# Patient Record
Sex: Female | Born: 1960 | Race: White | Hispanic: No | Marital: Married | Smoking: Never smoker
Health system: Southern US, Community
[De-identification: ages and names within clinical notes are randomized; demographics above are authoritative.]

## PROBLEM LIST (undated history)

## (undated) DIAGNOSIS — G43909 Migraine, unspecified, not intractable, without status migrainosus: Secondary | ICD-10-CM

## (undated) HISTORY — DX: Migraine, unspecified, not intractable, without status migrainosus: G43.909

## (undated) HISTORY — PX: TUBAL LIGATION: SHX77

## (undated) HISTORY — PX: LAPAROSCOPY: SHX197

---

## 1999-07-13 ENCOUNTER — Other Ambulatory Visit: Admission: RE | Admit: 1999-07-13 | Discharge: 1999-07-13 | Payer: Self-pay | Admitting: Internal Medicine

## 2001-09-10 ENCOUNTER — Other Ambulatory Visit: Admission: RE | Admit: 2001-09-10 | Discharge: 2001-09-10 | Payer: Self-pay | Admitting: Obstetrics and Gynecology

## 2002-05-09 HISTORY — PX: FOOT SURGERY: SHX648

## 2002-05-09 HISTORY — PX: ABDOMINAL SURGERY: SHX537

## 2003-05-15 ENCOUNTER — Other Ambulatory Visit: Admission: RE | Admit: 2003-05-15 | Discharge: 2003-05-15 | Payer: Self-pay | Admitting: Obstetrics and Gynecology

## 2003-10-03 ENCOUNTER — Inpatient Hospital Stay (HOSPITAL_COMMUNITY): Admission: RE | Admit: 2003-10-03 | Discharge: 2003-10-06 | Payer: Self-pay | Admitting: Obstetrics and Gynecology

## 2003-10-28 ENCOUNTER — Ambulatory Visit: Admission: RE | Admit: 2003-10-28 | Discharge: 2003-10-28 | Payer: Self-pay | Admitting: Family Medicine

## 2006-03-28 ENCOUNTER — Other Ambulatory Visit: Admission: RE | Admit: 2006-03-28 | Discharge: 2006-03-28 | Payer: Self-pay | Admitting: Obstetrics and Gynecology

## 2006-04-03 ENCOUNTER — Ambulatory Visit (HOSPITAL_COMMUNITY): Admission: RE | Admit: 2006-04-03 | Discharge: 2006-04-03 | Payer: Self-pay | Admitting: Obstetrics and Gynecology

## 2007-06-13 ENCOUNTER — Other Ambulatory Visit: Admission: RE | Admit: 2007-06-13 | Discharge: 2007-06-13 | Payer: Self-pay | Admitting: Obstetrics and Gynecology

## 2007-06-21 ENCOUNTER — Ambulatory Visit (HOSPITAL_COMMUNITY): Admission: RE | Admit: 2007-06-21 | Discharge: 2007-06-21 | Payer: Self-pay | Admitting: Obstetrics and Gynecology

## 2010-09-24 NOTE — Op Note (Signed)
NAME:  Brandi Elliott, Brandi Elliott                       ACCOUNT NO.:  [id number]   MEDICAL RECORD NO.:  [id number]                   PATIENT TYPE:  INP   LOCATION:  9304                                 FACILITY:  [facility]   PHYSICIAN:  Rudy Jew. Ashley Royalty, M.D.             DATE OF BIRTH:  June 15, 1960   DATE OF PROCEDURE:  10/03/2003  DATE OF DISCHARGE:                                 OPERATIVE REPORT   PREOPERATIVE DIAGNOSES:  1. Left ovarian cyst - persistent.  2. Pelvic pain.   POSTOPERATIVE DIAGNOSIS:  No residual ovary and cyst, adhesions, or  endometriosis.   PROCEDURE:  1. Diagnostic laparoscopy.  2. Exploratory laparotomy with repair of bleeding site in the rectus muscle.   SURGEON:  Rudy Jew. Ashley Royalty, M.D.   ANESTHESIA:  General.   ESTIMATED BLOOD LOSS:  200-300 mL.   COMPLICATIONS:  Bleeding from umbilical trocar site prohibiting satisfactory  visualization and laparoscopy and necessitating exploratory laparotomy.   PACKS AND DRAINS:  Foley.  Sponge, needle, and instrument counts were  correct x 2.   DESCRIPTION OF PROCEDURE:  The patient was taken to the operating room and  placed in the dorsal supine position.  After adequate general anesthesia was  administered, she was placed in the lithotomy position and prepped and  draped in the usual manner for abdominal and vaginal surgery.  Posterior  weighted retractor was placed per vagina.  The anterior lip of the cervix  was grasped with a single-tooth tenaculum.  Jarcho uterine manipulator was  placed per cervix.  The bladder was drained with a red rubber catheter.  Next a 1.2 cm infraumbilical incision was made in the transverse plane  through the patient's old umbilical incision.  Size 10-11 disposable  laparoscopic trocar was placed into the abdominal cavity.  Its location was  verified by placement of the laparoscope.  I could appreciate no evidence of  trauma.  The laparoscope had to be cleaned frequently, and it was  surmised  that there was a bleeder of some type in the umbilical incision that was  causing the field to be obscured.  A suprapubic 5 mm trocar was placed in  the left lower quadrant using transillumination and direct visualization  techniques.  The __________ suction irrigator was used to irrigate and clear  any blood away so the field could be viewed.  As best the operator could  determine, there was no evidence of any trauma to any intra-abdominal  viscera.  However, even with manipulation of the uterus, it was difficult to  visualize the adnexa or pelvis for any length of time due to the continued  dripping of blood across the laparoscopic lens.  Next, a 5 mm camera was  used to visualize the superior trocar from the inferior port.  As suspected,  there was a constant dripping of bleeding from the umbilical site, seemed  refractory to conservative measurements.  The operator transiently removed  the superior trocar and attempted to identify bleeding source, and no such  source was manifest. Hence, the decision was made to proceed with  laparotomy, both in order to ensure hemostasis and also to accomplish the  pelvic inspection and any indicated procedures that the operator could  __________ patient to perform.  Thus, the abdominal instruments were  removed, and the pneumoperitoneum was evacuated.  The superior fascia was  closed with 0 Vicryl in an interrupted fashion.  The skin was closed with 3-  0 Monocryl in a subcuticular fashion.   Attention was then turned to the laparotomy.  The choice of incision was  carefully evaluated.  Due to the fact that the bleeding vessel was  considerably higher than a Pfannenstiel incision would be, the decision was  made to proceed with a longitudinal incision.  The skin was incised from the  area just above the symphysis to several centimeters beneath the umbilicus.  The subcutaneous tissues were sharply and bluntly dissected down to the  fascia.   During this portion of the procedure, the patient was noted to  bleed from multiple sites as well which was consistent with that which  obvious occurred during the laparoscopy.  The fascia was encountered and  incised with the Mayo scissors and the Bovie.  The rectus muscles were  encountered and also noted to be quite friable.  The rectus muscles were  separated in the midline, exposing the peritoneum which was elevated with  hemostats and entered atraumatically.  The track of the previous superior  trocar was encountered, and a bleeder in the rectus muscle identified and  satisfactorily coagulated with the Bovie.  The peritoneal incision was  extended superiorly and inferiorly paying careful attention to avoid injury  to the bladder.  At this point, copious irrigation was accomplished, and any  clots and blood removed to obtain reasonable visualization of the abdomen  and pelvis.  Careful inspection of the small bowel revealed no evidence of  any trauma.  Balfour retractor was placed and the upper abdomen packed off.  The pelvis was the first time visualized satisfactory.  The uterus was  normal size, shape, and contour.  Careful inspection of the adnexa revealed  no evidence currently of any cysts on the ovary.  There was no evidence of  any endometriosis or adhesions either.  The fallopian tubes appeared normal  bilaterally.  The remainder of the peritoneal surfaces were smooth and  glistening.  Owing to the fact that there appeared to be no residual  pathology after satisfactory visualization of the pelvis was finally  possible, the patient was felt to have benefitted maximally from the  surgical procedure.  The packs were removed, and retractor was removed.  Copious irrigation was accomplished.  Hemostasis was noted.  The incision  was then closed with a running #1 PDS suture in a modified Smead-Jones  fashion.  The subcutaneous tissues were irrigated.  Small bleeders were controlled  with the Bovie.  Irrigation was accomplished again and revealed  hemostasis.  The skin was closed with staples.  During the laparotomy  portion of the procedure, a Foley catheter was in place and at the  conclusion of the procedure, the urine was clear and copious.   The patient was taken to the recovery room in excellent condition.  James A. Ashley Royalty, M.D.    JAM/MEDQ  D:  10/03/2003  T:  10/04/2003  Job:  [id number]

## 2010-09-24 NOTE — Discharge Summary (Signed)
NAME:  Brandi Elliott, Brandi Elliott                       ACCOUNT NO.:  [id number]   MEDICAL RECORD NO.:  [id number]                   PATIENT TYPE:  INP   LOCATION:  9304                                 FACILITY:  [facility]   PHYSICIAN:  Rudy Jew. Ashley Royalty, M.D.             DATE OF BIRTH:  1960/07/19   DATE OF ADMISSION:  10/03/2003  DATE OF DISCHARGE:  10/06/2003                                 DISCHARGE SUMMARY   DISCHARGE DIAGNOSIS:  Pelvic pain.   OPERATION AND SPECIAL PROCEDURES:  Diagnostic laparoscopy, exploratory  laparotomy, with repair of bleeding site in the rectus muscle.   CONSULTATIONS:  None.   DISCHARGE MEDICATIONS:  Percocet.   HISTORY AND PHYSICAL:  This is a 50 year old gravida 1, para 1, with a  history of a left adnexal cyst as well as pelvic pain.  She was brought in  for diagnostic/operative laparoscopy.  For the remainder of the history and  physical, please see chart.   HOSPITAL COURSE:  The patient was brought in for outpatient laparoscopy.  On  Oct 03, 2003, she was taken to the operating room and underwent a diagnostic  laparoscopy with exploratory laparotomy due to bleeding from an umbilical  trocar site, which prohibited satisfactory visualization at laparoscopy.  After the laparotomy was performed, the bleeding vessel was visualized and  controlled.  Moreover, as the pelvis was then for the first time amenable to  thorough inspection, it was noted that the previously-noted left ovarian  cyst was no longer present.  The procedure was completed and the patient  kept in the hospital for recuperation.  Her postoperative course was benign.  She was discharged home on Oct 06, 2003, afebrile and in satisfactory  condition.   ACCESSORY CLINICAL FINDINGS:  Hemoglobin and hematocrit on May 25 revealed  hemoglobin 11.7, hematocrit 35.8, white blood count 5000.  Repeat values  obtained Oct 04, 2003, were 8.8, 27.2, and 9500, respectively.   DISPOSITION:  The patient is to  return to [facility]  in four to six weeks for postoperative appointment.                                               James A. Ashley Royalty, M.D.    JAM/MEDQ  D:  10/29/2003  T:  10/31/2003  Job:  [id number]

## 2010-09-24 NOTE — H&P (Signed)
NAME:  Brandi Elliott, Brandi Elliott                       ACCOUNT NO.:  [id number]   MEDICAL RECORD NO.:  [id number]                   PATIENT TYPE:  AMB   LOCATION:  SDC                                  FACILITY:  [facility]   PHYSICIAN:  James A. Ashley Royalty, M.D.             DATE OF BIRTH:  05/11/1960   DATE OF ADMISSION:  10/03/2003  DATE OF DISCHARGE:                                HISTORY & PHYSICAL   HISTORY:  This is a 50 year old gravida 1, para 1 who had an annual  examination May 15, 2003 at which time the uterus appeared to be upper  limits of normal size.  She had a subsequent ultrasound which revealed the  uterus to be 10 x 5 x 5 cm.  There was a left adnexal cyst noted.  Patient  also complained of right lower quadrant discomfort.  The patient returned  for repeat ultrasound on or about Sep 11, 2003.  The ultrasound revealed  interval enlargement of the cyst up to 2.1 cm in greatest diameter.  At the  time I discussed the pros and cons of expectant management versus aggressive  surgical intervention in the form of operative laparoscopy with left ovarian  cystectomy.  Patient states she preferred the lateral.  Hence, she is for  diagnostic/operative laparoscopy with removal of left adnexal cyst.   MEDICATIONS:  None.   PAST MEDICAL HISTORY:  1. Medical:  Migraine.  2. Surgical:     a. LAP __________ .     b. Eye surgery at 50 years of age.   FAMILY HISTORY:  Positive for breast cancer.   SOCIAL HISTORY:  Patient denies use of tobacco or significant alcohol.   REVIEW OF SYSTEMS:  Noncontributory.   PHYSICAL EXAMINATION:  GENERAL:  Well-developed, well-nourished pleasant  female in no acute distress, afebrile.  SKIN:  Warm and dry without lesions.  LYMPH:  There is no supraclavicular, cervical, or inguinal adenopathy.  HEENT:  Normocephalic.  NECK:  Supple without thyromegaly.  CHEST:  Lungs are clear.  CARDIAC:  Regular rate and rhythm, without murmurs, gallops, or rubs.  BREAST:  Exam deferred.  ABDOMEN:  Soft, nontender, without masses or organomegaly.  Bowel sounds are  active.  MUSCULOSKELETAL:  Full range of motion without edema, cyanosis, or CVA  tenderness.  PELVIC EXAMINATION (Sep 11, 2003):  External genitalia within normal limits.  Vagina and cervix are without gross lesions.  Bimanual examination reveals  the uterus to be approximately 8 x 4 x 4 cm and no adnexal masses are  palpable.   IMPRESSION:  1. Left adnexal cysts - persistent.  2. Right lower quadrant discomfort - etiology uncertain.  Possibly secondary     to #1.   PLAN:  Diagnostic/operative laparoscopy with left ovarian cystectomy.  Risks, benefits, complications and alternatives are fully discussed with the  patient.  Possibility of unilateral salpingo-oophorectomy discussed and  accepted.  Possibility of exploratory laparotomy discussed and  accepted.  Questions invited and answered.                                               James A. Ashley Royalty, M.D.    JAM/MEDQ  D:  10/03/2003  T:  10/03/2003  Job:  [id number]

## 2012-08-20 ENCOUNTER — Ambulatory Visit: Payer: Self-pay | Admitting: Certified Nurse Midwife

## 2012-08-30 ENCOUNTER — Encounter: Payer: Self-pay | Admitting: Certified Nurse Midwife

## 2012-09-04 ENCOUNTER — Ambulatory Visit: Payer: Self-pay | Admitting: Certified Nurse Midwife

## 2014-03-10 ENCOUNTER — Encounter: Payer: Self-pay | Admitting: Certified Nurse Midwife

## 2017-01-03 DIAGNOSIS — R0981 Nasal congestion: Secondary | ICD-10-CM | POA: Diagnosis not present

## 2017-01-03 DIAGNOSIS — H6502 Acute serous otitis media, left ear: Secondary | ICD-10-CM | POA: Diagnosis not present

## 2017-01-24 DIAGNOSIS — H6502 Acute serous otitis media, left ear: Secondary | ICD-10-CM | POA: Diagnosis not present

## 2017-01-24 DIAGNOSIS — H6983 Other specified disorders of Eustachian tube, bilateral: Secondary | ICD-10-CM | POA: Diagnosis not present

## 2017-03-01 ENCOUNTER — Other Ambulatory Visit: Payer: Self-pay | Admitting: Obstetrics and Gynecology

## 2017-03-01 DIAGNOSIS — Z139 Encounter for screening, unspecified: Secondary | ICD-10-CM

## 2017-05-13 DIAGNOSIS — Z20818 Contact with and (suspected) exposure to other bacterial communicable diseases: Secondary | ICD-10-CM | POA: Diagnosis not present

## 2017-05-13 DIAGNOSIS — J029 Acute pharyngitis, unspecified: Secondary | ICD-10-CM | POA: Diagnosis not present

## 2017-05-13 DIAGNOSIS — J209 Acute bronchitis, unspecified: Secondary | ICD-10-CM | POA: Diagnosis not present

## 2017-05-13 DIAGNOSIS — J019 Acute sinusitis, unspecified: Secondary | ICD-10-CM | POA: Diagnosis not present

## 2017-11-30 ENCOUNTER — Encounter: Payer: Self-pay | Admitting: Obstetrics and Gynecology

## 2017-11-30 ENCOUNTER — Ambulatory Visit (INDEPENDENT_AMBULATORY_CARE_PROVIDER_SITE_OTHER): Payer: BLUE CROSS/BLUE SHIELD | Admitting: Obstetrics and Gynecology

## 2017-11-30 ENCOUNTER — Encounter: Payer: Self-pay | Admitting: Gastroenterology

## 2017-11-30 VITALS — BP 119/80 | HR 62 | Ht 63.0 in | Wt 201.1 lb

## 2017-11-30 DIAGNOSIS — Z113 Encounter for screening for infections with a predominantly sexual mode of transmission: Secondary | ICD-10-CM

## 2017-11-30 DIAGNOSIS — Z6835 Body mass index (BMI) 35.0-35.9, adult: Secondary | ICD-10-CM

## 2017-11-30 DIAGNOSIS — Z1151 Encounter for screening for human papillomavirus (HPV): Secondary | ICD-10-CM

## 2017-11-30 DIAGNOSIS — Z124 Encounter for screening for malignant neoplasm of cervix: Secondary | ICD-10-CM | POA: Diagnosis not present

## 2017-11-30 DIAGNOSIS — Z0000 Encounter for general adult medical examination without abnormal findings: Secondary | ICD-10-CM | POA: Diagnosis not present

## 2017-11-30 DIAGNOSIS — Z01419 Encounter for gynecological examination (general) (routine) without abnormal findings: Secondary | ICD-10-CM

## 2017-11-30 DIAGNOSIS — N393 Stress incontinence (female) (male): Secondary | ICD-10-CM

## 2017-11-30 DIAGNOSIS — R61 Generalized hyperhidrosis: Secondary | ICD-10-CM

## 2017-11-30 NOTE — Progress Notes (Signed)
GYNECOLOGY ANNUAL PREVENTATIVE CARE ENCOUNTER NOTE  Subjective:   Brandi Elliott is a 57 y.o. G43P1001 female here for a annual gynecologic exam. Current complaints: leaking small amount with sneeze.  Denies abnormal vaginal bleeding, discharge, pelvic pain, problems with intercourse or other gynecologic concerns. Noticed the last few months she has had night sweats and wakes up with a headache. Concerned that she has lost about 50 pounds on weight watchers but unable to lose more weight.   Gynecologic History No LMP recorded. last period age 68 Contraception: post menopausal status Last Pap: about 6 years ago. Results were: normal Last mammogram: about 6 years ago. Results were: normal Mother was dx with breast cancer age 69, still living and doing well  Obstetric History OB History  Gravida Para Term Preterm AB Living  '1 1 1     1  ' SAB TAB Ectopic Multiple Live Births               # Outcome Date GA Lbr Len/2nd Weight Sex Delivery Anes PTL Lv  1 Term 03/24/85    F Vag-Spont       History reviewed. No pertinent past medical history.  Past Surgical History:  Procedure Laterality Date  . ABDOMINAL SURGERY  2004  . FOOT SURGERY  2004  patient reports she was to have laparoscopic surgery for ovarian cyst but she started bleeding so they made "a big cut" and cyst was gone once they were inside  No current outpatient medications on file prior to visit.   No current facility-administered medications on file prior to visit.    Not on File  Social History   Socioeconomic History  . Marital status: Married    Spouse name: Not on file  . Number of children: Not on file  . Years of education: Not on file  . Highest education level: Not on file  Occupational History  . Not on file  Social Needs  . Financial resource strain: Not on file  . Food insecurity:    Worry: Not on file    Inability: Not on file  . Transportation needs:    Medical: Not on file    Non-medical:  Not on file  Tobacco Use  . Smoking status: Never Smoker  . Smokeless tobacco: Never Used  Substance and Sexual Activity  . Alcohol use: Not Currently  . Drug use: Not Currently  . Sexual activity: Yes  Lifestyle  . Physical activity:    Days per week: Not on file    Minutes per session: Not on file  . Stress: Not on file  Relationships  . Social connections:    Talks on phone: Not on file    Gets together: Not on file    Attends religious service: Not on file    Active member of club or organization: Not on file    Attends meetings of clubs or organizations: Not on file    Relationship status: Not on file  . Intimate partner violence:    Fear of current or ex partner: Not on file    Emotionally abused: Not on file    Physically abused: Not on file    Forced sexual activity: Not on file  Other Topics Concern  . Not on file  Social History Narrative  . Not on file    Family History  Problem Relation Age of Onset  . Breast cancer Mother 58  . Lung cancer Father    Diet: eating well, has  lost about 50 pounds on weight watchers Exercise: walks quite a bit  The following portions of the patient's history were reviewed and updated as appropriate: allergies, current medications, past family history, past medical history, past social history, past surgical history and problem list.  Review of Systems Pertinent items are noted in HPI.   Objective:  BP 119/80   Pulse 62   Ht '5\' 3"'  (1.6 m)   Wt 201 lb 1.6 oz (91.2 kg)   BMI 35.62 kg/m  CONSTITUTIONAL: Well-developed, well-nourished female in no acute distress.  HENT:  Normocephalic, atraumatic, External right and left ear normal. Oropharynx is clear and moist EYES: Conjunctivae and EOM are normal. Pupils are equal, round, and reactive to light. No scleral icterus.  NECK: Normal range of motion, supple, no masses.  Normal thyroid.  SKIN: Skin is warm and dry. No rash noted. Not diaphoretic. No erythema. No  pallor. NEUROLOGIC: Alert and oriented to person, place, and time. Normal reflexes, muscle tone coordination. No cranial nerve deficit noted. PSYCHIATRIC: Normal mood and affect. Normal behavior. Normal judgment and thought content. CARDIOVASCULAR: Normal heart rate noted, regular rhythm RESPIRATORY: Clear to auscultation bilaterally. Effort and breath sounds normal, no problems with respiration noted. BREASTS: Symmetric in size. No masses, skin changes, nipple drainage, or lymphadenopathy. ABDOMEN: Soft, normal bowel sounds, no distention noted.  No tenderness, rebound or guarding. Well healed abdominal incision PELVIC: Normal appearing external genitalia; normal appearing vaginal mucosa and cervix.  No abnormal discharge noted.  Pap smear obtained.  Normal uterine size, no other palpable masses, no uterine or adnexal tenderness. MUSCULOSKELETAL: Normal range of motion. No tenderness.  No cyanosis, clubbing, or edema.  2+ distal pulses.  Assessment and Plan:   1. Well woman exam - Cytology - PAP - MM 3D SCREEN BREAST BILATERAL; Future - Ambulatory referral to Gastroenterology - CBC - Lipid Profile - TSH - Comp Met (CMET) - Ambulatory referral to Internal Medicine  2. Routine screening for STI (sexually transmitted infection) - declines gonorrhea/chlamydia - HIV antibody - Hepatitis C Antibody - RPR - Hepatitis B surface antigen  3. SUI (stress urinary incontinence, female) Reviewed kegel's, emphasized strengthening pelvic floor muscles - she will call for appt if worsens  4. Night sweats Not overly bothersome to her  5. BMI 35.0-35.9,adult Encouraged increased exercise as she is on weight watchers and has lost 50 pounds but has plateaued  Will follow up results of pap smear/STI screen and manage accordingly. Encouraged improvement in diet and exercise.  Mammogram scheduled Referral to GI for Colonoscopy  Routine preventative health maintenance measures emphasized. Please  refer to After Visit Summary for other counseling recommendations.     Feliz Beam, M.D. [facility]

## 2017-11-30 NOTE — Patient Instructions (Signed)

## 2017-11-30 NOTE — Progress Notes (Signed)
Pt is 57yo G1P1 Last MMG 6 years ago: normal per pt Never had colonoscopy Last pap 6 years ago: normal per pt Pt states she lost 50 lbs about a year ago and has since "stalled out" and would like to talk about weight loss.

## 2017-12-01 LAB — COMPREHENSIVE METABOLIC PANEL
ALBUMIN: 4.4 g/dL (ref 3.5–5.5)
ALT: 22 IU/L (ref 0–32)
AST: 14 IU/L (ref 0–40)
Albumin/Globulin Ratio: 2 (ref 1.2–2.2)
Alkaline Phosphatase: 97 IU/L (ref 39–117)
BUN / CREAT RATIO: 16 (ref 9–23)
BUN: 13 mg/dL (ref 6–24)
Bilirubin Total: 0.4 mg/dL (ref 0.0–1.2)
CO2: 22 mmol/L (ref 20–29)
CREATININE: 0.81 mg/dL (ref 0.57–1.00)
Calcium: 9.2 mg/dL (ref 8.7–10.2)
Chloride: 106 mmol/L (ref 96–106)
GFR calc Af Amer: 95 mL/min/{1.73_m2} (ref >59)
GFR, EST NON AFRICAN AMERICAN: 82 mL/min/{1.73_m2} (ref >59)
GLOBULIN, TOTAL: 2.2 g/dL (ref 1.5–4.5)
Glucose: 77 mg/dL (ref 65–99)
Potassium: 3.9 mmol/L (ref 3.5–5.2)
SODIUM: 144 mmol/L (ref 134–144)
Total Protein: 6.6 g/dL (ref 6.0–8.5)

## 2017-12-01 LAB — CBC
Hematocrit: 43.1 % (ref 34.0–46.6)
Hemoglobin: 14.1 g/dL (ref 11.1–15.9)
MCH: 29.8 pg (ref 26.6–33.0)
MCHC: 32.7 g/dL (ref 31.5–35.7)
MCV: 91 fL (ref 79–97)
PLATELETS: 168 10*3/uL (ref 150–450)
RBC: 4.73 x10E6/uL (ref 3.77–5.28)
RDW: 13.3 % (ref 12.3–15.4)
WBC: 4.5 10*3/uL (ref 3.4–10.8)

## 2017-12-01 LAB — HEPATITIS C ANTIBODY

## 2017-12-01 LAB — HIV ANTIBODY (ROUTINE TESTING W REFLEX): HIV SCREEN 4TH GENERATION: NONREACTIVE

## 2017-12-01 LAB — LIPID PANEL
CHOLESTEROL TOTAL: 244 mg/dL — AB (ref 100–199)
Chol/HDL Ratio: 3.4 ratio (ref 0.0–4.4)
HDL: 72 mg/dL (ref >39)
LDL Calculated: 160 mg/dL — ABNORMAL HIGH (ref 0–99)
Triglycerides: 61 mg/dL (ref 0–149)
VLDL CHOLESTEROL CAL: 12 mg/dL (ref 5–40)

## 2017-12-01 LAB — TSH: TSH: 2.86 u[IU]/mL (ref 0.450–4.500)

## 2017-12-01 LAB — HEPATITIS B SURFACE ANTIGEN: Hepatitis B Surface Ag: NEGATIVE

## 2017-12-01 LAB — RPR: RPR: NONREACTIVE

## 2017-12-04 ENCOUNTER — Encounter: Payer: Self-pay | Admitting: Certified Nurse Midwife

## 2017-12-06 LAB — CYTOLOGY - PAP
Diagnosis: NEGATIVE
HPV: NOT DETECTED

## 2017-12-20 ENCOUNTER — Ambulatory Visit
Admission: RE | Admit: 2017-12-20 | Discharge: 2017-12-20 | Disposition: A | Discharge status: Home / Self Care | Payer: BLUE CROSS/BLUE SHIELD | Source: Ambulatory Visit | Attending: Obstetrics and Gynecology | Admitting: Obstetrics and Gynecology

## 2017-12-20 DIAGNOSIS — Z1231 Encounter for screening mammogram for malignant neoplasm of breast: Secondary | ICD-10-CM | POA: Diagnosis not present

## 2017-12-20 DIAGNOSIS — Z01419 Encounter for gynecological examination (general) (routine) without abnormal findings: Secondary | ICD-10-CM

## 2018-01-11 ENCOUNTER — Ambulatory Visit (AMBULATORY_SURGERY_CENTER): Payer: Self-pay | Admitting: *Deleted

## 2018-01-11 VITALS — Ht 64.0 in | Wt 206.2 lb

## 2018-01-11 DIAGNOSIS — Z1211 Encounter for screening for malignant neoplasm of colon: Secondary | ICD-10-CM

## 2018-01-11 MED ORDER — NA SULFATE-K SULFATE-MG SULF 17.5-3.13-1.6 GM/177ML PO SOLN: 1.0000 | Freq: Once | ORAL | 0 refills | Status: AC

## 2018-01-11 NOTE — Progress Notes (Signed)
Denies allergies to eggs or soy products. Denies complications with sedation or anesthesia. Denies O2 use. Denies use of diet or weight loss medications.  Emmi instructions given for colonoscopy.  

## 2018-01-12 ENCOUNTER — Encounter: Payer: Self-pay | Admitting: Gastroenterology

## 2018-01-25 ENCOUNTER — Ambulatory Visit (AMBULATORY_SURGERY_CENTER): Payer: BLUE CROSS/BLUE SHIELD | Admitting: Gastroenterology

## 2018-01-25 ENCOUNTER — Encounter: Payer: Self-pay | Admitting: Gastroenterology

## 2018-01-25 VITALS — BP 106/79 | HR 53 | Temp 98.6°F | Resp 13 | Ht 63.0 in | Wt 201.0 lb

## 2018-01-25 DIAGNOSIS — Z1211 Encounter for screening for malignant neoplasm of colon: Secondary | ICD-10-CM

## 2018-01-25 DIAGNOSIS — D120 Benign neoplasm of cecum: Secondary | ICD-10-CM | POA: Diagnosis not present

## 2018-01-25 DIAGNOSIS — D125 Benign neoplasm of sigmoid colon: Secondary | ICD-10-CM

## 2018-01-25 DIAGNOSIS — D122 Benign neoplasm of ascending colon: Secondary | ICD-10-CM | POA: Diagnosis not present

## 2018-01-25 MED ORDER — SODIUM CHLORIDE 0.9 % IV SOLN
500.0000 mL | Freq: Once | INTRAVENOUS | Status: AC
Start: 2018-01-25 — End: ?

## 2018-01-25 NOTE — Patient Instructions (Signed)
Handouts given on polyps, diverticulosis and hemorrhoids. No NSAIDS for 72 hours.   YOU HAD AN ENDOSCOPIC PROCEDURE TODAY AT [facility]:   Refer to the procedure report that was given to you for any specific questions about what was found during the examination.  If the procedure report does not answer your questions, please call your gastroenterologist to clarify.  If you requested that your care partner not be given the details of your procedure findings, then the procedure report has been included in a sealed envelope for you to review at your convenience later.  YOU SHOULD EXPECT: Some feelings of bloating in the abdomen. Passage of more gas than usual.  Walking can help get rid of the air that was put into your GI tract during the procedure and reduce the bloating. If you had a lower endoscopy (such as a colonoscopy or flexible sigmoidoscopy) you may notice spotting of blood in your stool or on the toilet paper. If you underwent a bowel prep for your procedure, you may not have a normal bowel movement for a few days.  Please Note:  You might notice some irritation and congestion in your nose or some drainage.  This is from the oxygen used during your procedure.  There is no need for concern and it should clear up in a day or so.  SYMPTOMS TO REPORT IMMEDIATELY:   Following lower endoscopy (colonoscopy or flexible sigmoidoscopy):  Excessive amounts of blood in the stool  Significant tenderness or worsening of abdominal pains  Swelling of the abdomen that is new, acute  Fever of 100F or higher   For urgent or emergent issues, a gastroenterologist can be reached at any hour by calling [phone].   DIET:  We do recommend a small meal at first, but then you may proceed to your regular diet.  Drink plenty of fluids but you should avoid alcoholic beverages for 24 hours.  ACTIVITY:  You should plan to take it easy for the rest of today and you should NOT DRIVE or use  heavy machinery until tomorrow (because of the sedation medicines used during the test).    FOLLOW UP: Our staff will call the number listed on your records the next business day following your procedure to check on you and address any questions or concerns that you may have regarding the information given to you following your procedure. If we do not reach you, we will leave a message.  However, if you are feeling well and you are not experiencing any problems, there is no need to return our call.  We will assume that you have returned to your regular daily activities without incident.  If any biopsies were taken you will be contacted by phone or by letter within the next 1-3 weeks.  Please call us at [phone] if you have not heard about the biopsies in 3 weeks.    SIGNATURES/CONFIDENTIALITY: You and/or your care partner have signed paperwork which will be entered into your electronic medical record.  These signatures attest to the fact that that the information above on your After Visit Summary has been reviewed and is understood.  Full responsibility of the confidentiality of this discharge information lies with you and/or your care-partner.

## 2018-01-25 NOTE — Op Note (Signed)
[facility] Patient Name: Brandi Elliott Procedure Date: 01/25/2018 10:52 AM MRN: [id number] Endoscopist: Justice Britain , MD Age: 57 Referring MD:  Date of Birth: [birth year] Gender: Female Account #: [id number] Procedure:                Colonoscopy Indications:              Screening for colorectal malignant neoplasm Medicines:                Monitored Anesthesia Care Procedure:                Pre-Anesthesia Assessment:                           - Prior to the procedure, a History and Physical                            was performed, and patient medications and                            allergies were reviewed. The patient's tolerance of                            previous anesthesia was also reviewed. The risks                            and benefits of the procedure and the sedation                            options and risks were discussed with the patient.                            All questions were answered, and informed consent                            was obtained. Prior Anticoagulants: The patient has                            taken ibuprofen. ASA Grade Assessment: II - A                            patient with mild systemic disease. After reviewing                            the risks and benefits, the patient was deemed in                            satisfactory condition to undergo the procedure.                           After obtaining informed consent, the colonoscope                            was passed under direct vision. Throughout the  procedure, the patient's blood pressure, pulse, and                            oxygen saturations were monitored continuously. The                            Colonoscope was introduced through the anus and                            advanced to the the cecum, identified by                            appendiceal orifice and ileocecal valve. The                            colonoscopy  was performed without difficulty. The                            patient tolerated the procedure well. The quality                            of the bowel preparation was evaluated using the                            BBPS [facility] Bowel Preparation Scale) with scores                            of: Right Colon = 3 (entire mucosa seen well with                            no residual staining, small fragments of stool or                            opaque liquid), Transverse Colon = 2 (minor amount                            of residual staining, small fragments of stool                            and/or opaque liquid, but mucosa seen well) and                            Left Colon = 2 (minor amount of residual staining,                            small fragments of stool and/or opaque liquid, but                            mucosa seen well). The total BBPS score equals 7.                            The quality of the bowel preparation was fair. Scope In: 11:17:34 AM  Scope Out: 11:52:52 AM Scope Withdrawal Time: 0 hours 29 minutes 16 seconds  Total Procedure Duration: 0 hours 35 minutes 18 seconds  Findings:                 The digital rectal exam findings include                            non-thrombosed external hemorrhoids. Pertinent                            negatives include no palpable rectal lesions.                           The ileocecal valve appeared normal.                           Six sessile polyps were found in the sigmoid colon,                            ascending colon and cecum. The polyps were 3 to 7                            mm in size. These polyps were removed with a cold                            snare. Resection and retrieval were complete.                           Many small and large-mouthed diverticula were found                            in the recto-sigmoid colon, sigmoid colon,                            descending colon, transverse colon and ascending                             colon.                           Normal mucosa was found in the entire colon                            otherwise.                           Non-bleeding non-thrombosed external and internal                            hemorrhoids were found during retroflexion, during                            perianal exam and during digital exam. The                            hemorrhoids  were Grade I (internal hemorrhoids that                            do not prolapse). Complications:            No immediate complications. Estimated Blood Loss:     Estimated blood loss was minimal. Impression:               - Preparation of the colon was fair.                           - Non-thrombosed external hemorrhoids found on                            digital rectal exam.                           - The examined portion of the ileum was normal.                           - Six 3 to 7 mm polyps in the sigmoid colon, in the                            ascending colon and in the cecum, removed with a                            cold snare. Resected and retrieved.                           - Diverticulosis in the recto-sigmoid colon, in the                            sigmoid colon, in the descending colon, in the                            transverse colon and in the ascending colon.                           - Normal mucosa in the entire examined colon                            otherwise.                           - Non-bleeding non-thrombosed external and internal                            hemorrhoids. Recommendation:           - The patient will be observed post-procedure,                            until all discharge criteria are met.                           - Discharge patient to home.                           -  Patient has a contact number available for                            emergencies. The signs and symptoms of potential                            delayed complications were  discussed with the                            patient. Return to normal activities tomorrow.                            Written discharge instructions were provided to the                            patient.                           - Resume previous diet.                           - Continue present medications.                           - No aspirin, ibuprofen, naproxen, or other                            non-steroidal anti-inflammatory drugs for 72 hours.                           - Await pathology results.                           - Repeat colonoscopy in 3 years for surveillance                            based on pathology results. Would plan for a                            different preparation due to fairness on this                            procedure.                           - The findings and recommendations were discussed                            with the patient.                           - The findings and recommendations were discussed                            with the patient's family. Justice Britain, MD 01/25/2018 12:00:00 PM

## 2018-01-25 NOTE — Progress Notes (Signed)
Report to PACU, RN, vss, BBS= Clear.  

## 2018-01-25 NOTE — Progress Notes (Signed)
Pt's states no medical or surgical changes since previsit or office visit. 

## 2018-01-26 ENCOUNTER — Telehealth: Payer: Self-pay

## 2018-01-26 NOTE — Telephone Encounter (Signed)
  Follow up Call-  Call back number 01/25/2018  Post procedure Call Back phone  # [phone]  Permission to leave phone message Yes  Some recent data might be hidden     Patient questions:  Do you have a fever, pain , or abdominal swelling? No. Pain Score  0 *  Have you tolerated food without any problems? Yes.    Have you been able to return to your normal activities? Yes.    Do you have any questions about your discharge instructions: Diet   No. Medications  No. Follow up visit  No.  Do you have questions or concerns about your Care? No.  Actions: * If pain score is 4 or above: No action needed, pain <4.

## 2018-01-29 ENCOUNTER — Encounter: Payer: Self-pay | Admitting: Gastroenterology

## 2018-02-02 DIAGNOSIS — M25561 Pain in right knee: Secondary | ICD-10-CM | POA: Diagnosis not present

## 2018-02-02 DIAGNOSIS — M25562 Pain in left knee: Secondary | ICD-10-CM | POA: Diagnosis not present

## 2019-01-12 IMAGING — MG DIGITAL SCREENING BILATERAL MAMMOGRAM WITH TOMO AND CAD
8 series · 8 of 24 positions shown · non-contrast
Comparison: Previous exam(s).

CLINICAL DATA: Screening.

EXAM:
DIGITAL SCREENING BILATERAL MAMMOGRAM WITH TOMO AND CAD

[L MLO synth-2D]
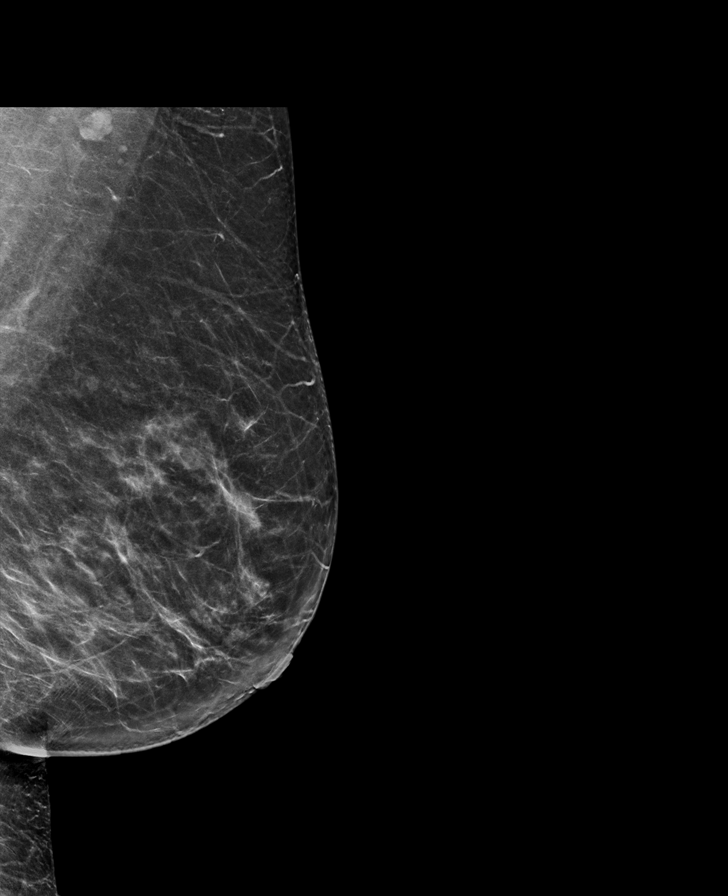

[L CC synth-2D]
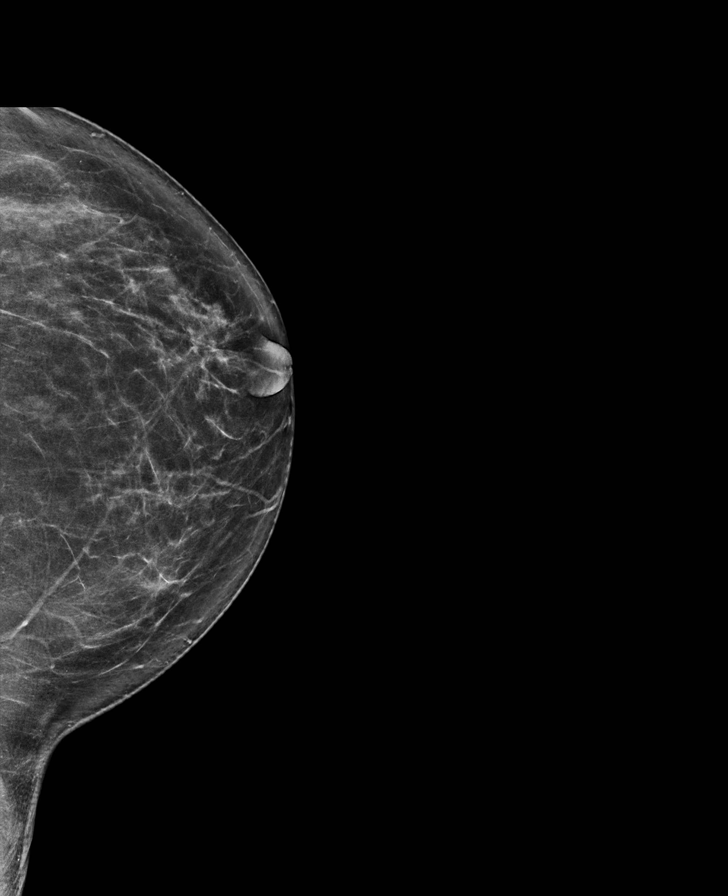

[R MLO synth-2D]
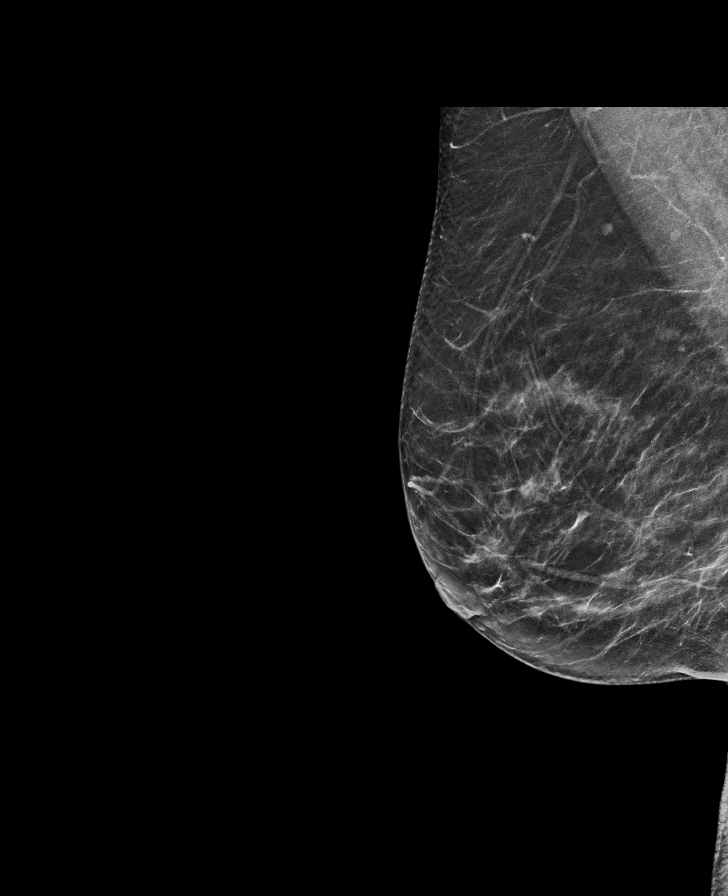

[R CC synth-2D]
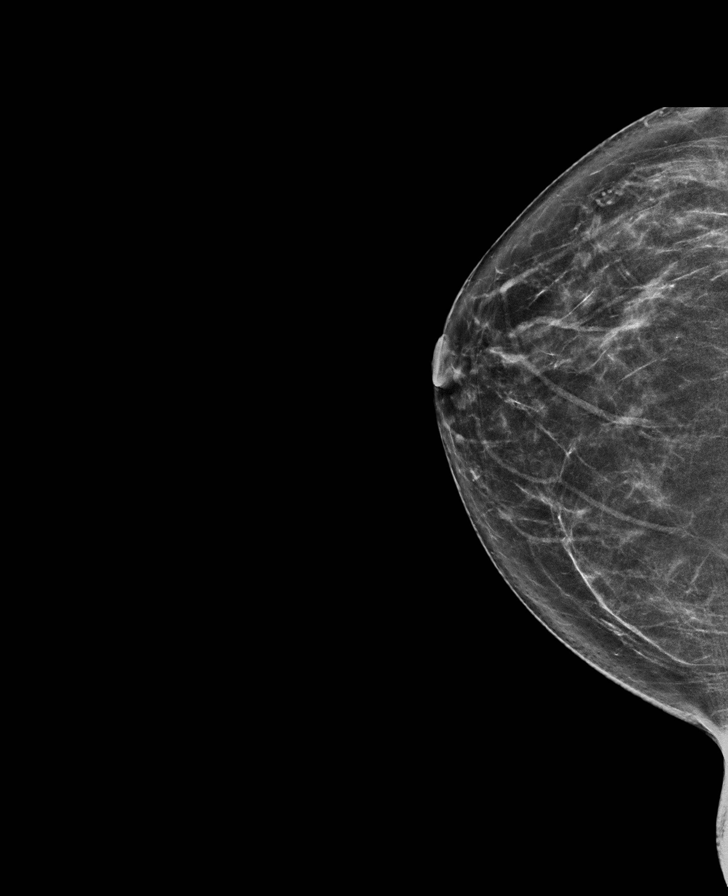

[L CC tomo · tomo slice 39/77.0]
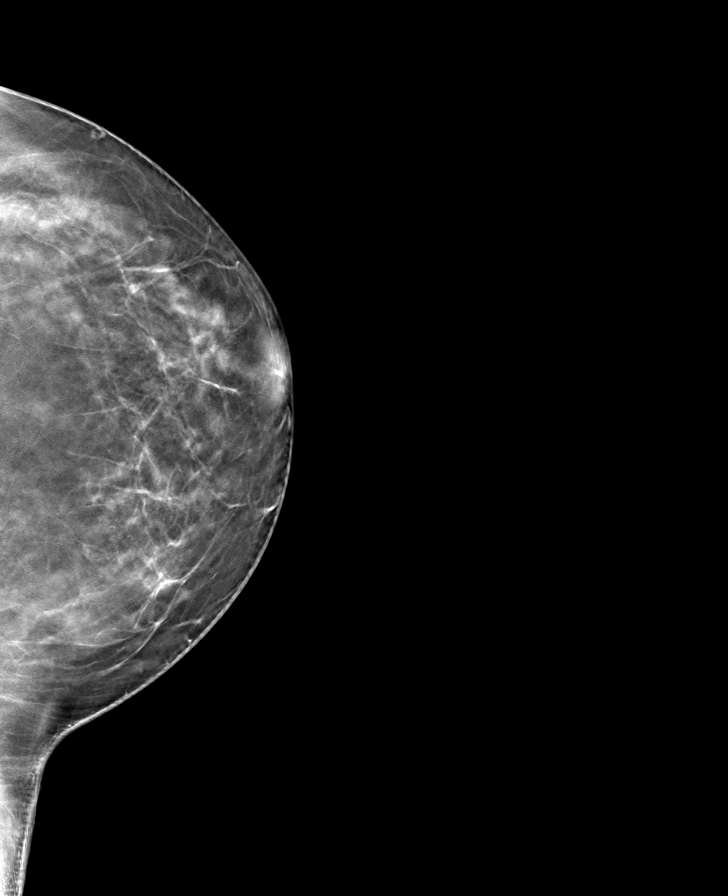

[L MLO tomo · tomo slice 39/76.0]
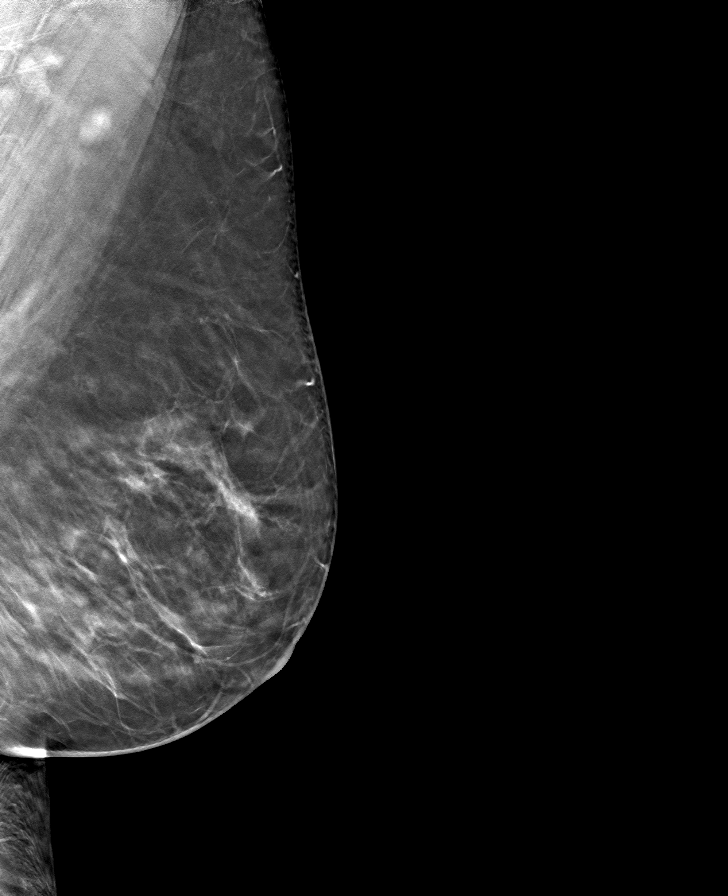

[R MLO tomo · tomo slice 35/68.0]
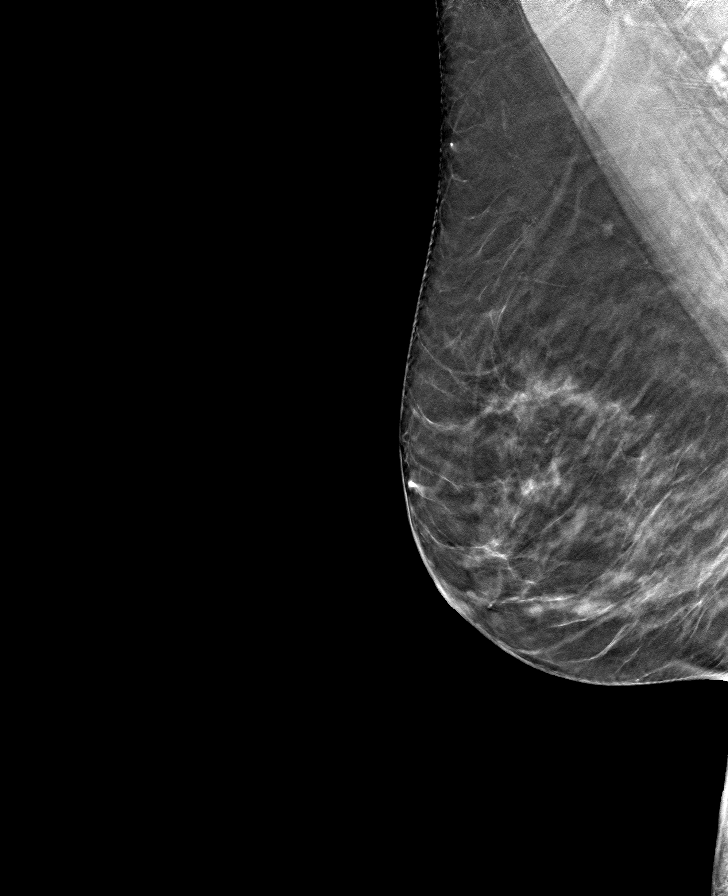

[R CC tomo · tomo slice 35/69.0]
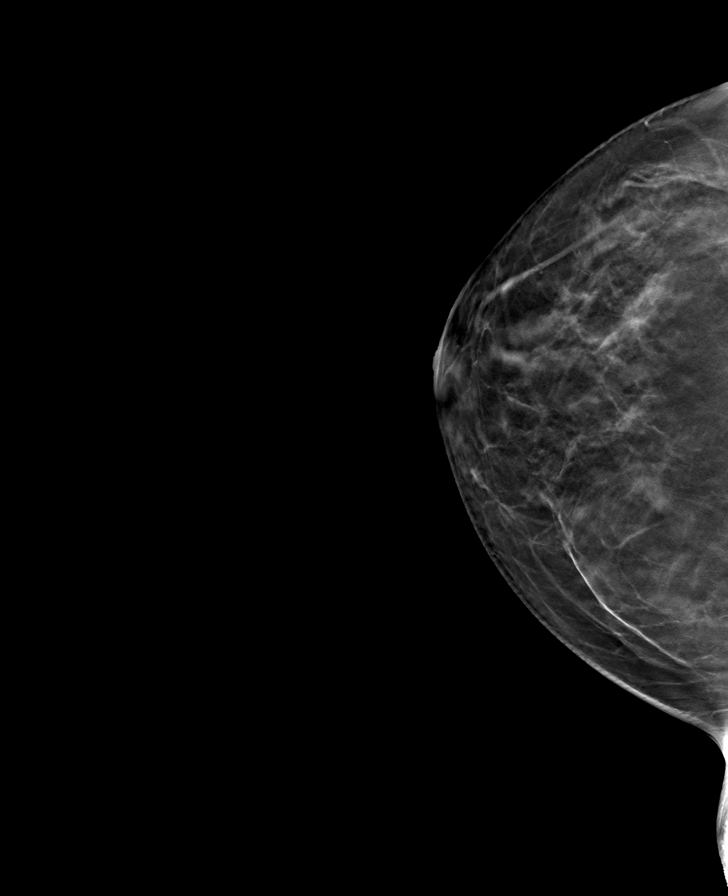

[8 of 24 positions shown; findings below may reference images not displayed]

ACR Breast Density Category b: There are scattered areas of
fibroglandular density.
FINDINGS: There are no findings suspicious for malignancy. Images were
processed with CAD.
IMPRESSION: No mammographic evidence of malignancy. A result letter of this
screening mammogram will be mailed directly to the patient.

RECOMMENDATION:
Screening mammogram in one year. (Code:[id number])

BI-RADS CATEGORY  1: Negative.

## 2019-07-29 ENCOUNTER — Encounter: Payer: Self-pay | Admitting: Certified Nurse Midwife

## 2023-11-20 ENCOUNTER — Ambulatory Visit (INDEPENDENT_AMBULATORY_CARE_PROVIDER_SITE_OTHER): Payer: Self-pay | Admitting: Physician Assistant

## 2023-11-20 ENCOUNTER — Other Ambulatory Visit: Payer: Self-pay

## 2023-11-20 DIAGNOSIS — M7541 Impingement syndrome of right shoulder: Secondary | ICD-10-CM | POA: Diagnosis not present

## 2023-11-20 DIAGNOSIS — M25511 Pain in right shoulder: Secondary | ICD-10-CM

## 2023-11-20 MED ORDER — LIDOCAINE HCL 1 % IJ SOLN
5.0000 mL | INTRAMUSCULAR | Status: AC | PRN
  Administered 2023-11-20: 5 mL

## 2023-11-20 MED ORDER — METHYLPREDNISOLONE ACETATE 40 MG/ML IJ SUSP
40.0000 mg | INTRAMUSCULAR | Status: AC | PRN
  Administered 2023-11-20: 40 mg via INTRA_ARTICULAR

## 2023-11-21 ENCOUNTER — Encounter: Payer: Self-pay | Admitting: Gastroenterology

## 2024-03-11 ENCOUNTER — Encounter: Payer: Self-pay | Admitting: Radiology

## 2024-03-27 ENCOUNTER — Encounter: Payer: Self-pay | Admitting: Physician Assistant

## 2024-03-27 ENCOUNTER — Ambulatory Visit: Admitting: Physician Assistant

## 2024-03-27 ENCOUNTER — Other Ambulatory Visit: Payer: Self-pay

## 2024-03-27 DIAGNOSIS — M19071 Primary osteoarthritis, right ankle and foot: Secondary | ICD-10-CM | POA: Diagnosis not present

## 2024-03-27 DIAGNOSIS — M2142 Flat foot [pes planus] (acquired), left foot: Secondary | ICD-10-CM

## 2024-03-27 DIAGNOSIS — M2141 Flat foot [pes planus] (acquired), right foot: Secondary | ICD-10-CM | POA: Diagnosis not present

## 2024-03-27 DIAGNOSIS — M79671 Pain in right foot: Secondary | ICD-10-CM

## 2024-03-27 MED ORDER — PREDNISONE 10 MG PO TABS: 10.0000 mg | ORAL_TABLET | Freq: Every day | ORAL | 1 refills | Status: DC

## 2024-03-27 NOTE — Progress Notes (Signed)
 Office Visit Note   Patient: Brandi Elliott           Date of Birth: 06/07/1960           MRN: [id number] Visit Date: 03/27/2024              Requested by: No referring provider defined for this encounter. PCP: Patient, No Pcp Per  Chief Complaint  Patient presents with   Right Foot - Pain      HPI: 63 y/o female with firm Knot on the right foot that is painful.  She has noticed this about a month ago.  She denies trauma.  She states when she first starts walking the foot is painful, but the more she walks the better the discomfort is.  The knot is dorsal lateral on the right foot.    Assessment & Plan: Visit Diagnoses:  1. Pain in right foot     Plan: She will take prednisone daily with a meal to help with the inflammation.  I recommended she get Sole cork orthotics.  By supporting the arch and improving alignment, orthotics help control the inward rolling of the foot, which is a common issue with flat feet.  Orthotics provide cushioning to absorb shock, making it more comfortable to walk and stand, especially on hard surfaces.  This will help her arthritic joints as well.    Follow-Up Instructions: No follow-ups on file.   Ortho Exam  Patient is alert, oriented, no adenopathy, well-dressed, normal affect, normal respiratory effort. Palpable DP pulse.  Firm bony  prominence dorsal lateral foot with slight tenderness to palpation.  In standing she has pes planus foot.  She is able to stand on her tip toes with holding on to a stable object.  Ankle dorsiflexion, plantar flexion, inversion and eversion do not increase pian against resistance.    Imaging: Right foot x ray demonstrates a dorsal spur on the later view of the talus.  Mild arthritis midfoot joints, pes planus.    Labs: No results found for: HGBA1C, ESRSEDRATE, CRP, LABURIC, REPTSTATUS, GRAMSTAIN, CULT, LABORGA   Lab Results  Component Value Date   ALBUMIN 4.4 11/30/2017    No results  found for: MG No results found for: VD25OH  No results found for: PREALBUMIN    Latest Ref Rng & Units 11/30/2017    2:43 PM  CBC EXTENDED  WBC 3.4 - 10.8 x10E3/uL 4.5   RBC 3.77 - 5.28 x10E6/uL 4.73   Hemoglobin 11.1 - 15.9 g/dL 85.8   HCT 65.9 - 53.3 % 43.1   Platelets 150 - 450 x10E3/uL 168      There is no height or weight on file to calculate BMI.  Orders:  Orders Placed This Encounter  Procedures   XR Foot Complete Right   No orders of the defined types were placed in this encounter.    Procedures: No procedures performed  Clinical Data: No additional findings.  ROS:  All other systems negative, except as noted in the HPI. Review of Systems  Objective: Vital Signs: There were no vitals taken for this visit.  Specialty Comments:  No specialty comments available.  PMFS History: There are no active problems to display for this patient.  Past Medical History:  Diagnosis Date   Migraines     Family History  Problem Relation Age of Onset   Breast cancer Mother 86   Hypertension Mother    Lung cancer Father    Coronary artery disease Father  Colon cancer Neg Hx    Esophageal cancer Neg Hx    Rectal cancer Neg Hx    Stomach cancer Neg Hx     Past Surgical History:  Procedure Laterality Date   ABDOMINAL SURGERY  2004   FOOT SURGERY  2004   LAPAROSCOPY     TUBAL LIGATION     BTSP   Social History   Occupational History   Not on file  Tobacco Use   Smoking status: Never   Smokeless tobacco: Never  Vaping Use   Vaping status: Never Used  Substance and Sexual Activity   Alcohol use: Not Currently   Drug use: Not Currently   Sexual activity: Yes

## 2024-06-09 ENCOUNTER — Other Ambulatory Visit: Payer: Self-pay | Admitting: Physician Assistant

## 2024-06-09 DIAGNOSIS — M79671 Pain in right foot: Secondary | ICD-10-CM
